# Patient Record
Sex: Female | Born: 1955 | Race: Black or African American | Hispanic: No | Smoking: Never smoker
Health system: Southern US, Community
[De-identification: ages and names within clinical notes are randomized; demographics above are authoritative.]

## PROBLEM LIST (undated history)

## (undated) DIAGNOSIS — I10 Essential (primary) hypertension: Secondary | ICD-10-CM

## (undated) HISTORY — DX: Essential (primary) hypertension: I10

## (undated) HISTORY — PX: DILATION AND CURETTAGE OF UTERUS: SHX78

---

## 2004-10-15 ENCOUNTER — Ambulatory Visit: Payer: Self-pay | Admitting: Family Medicine

## 2007-06-27 ENCOUNTER — Ambulatory Visit: Payer: Self-pay | Admitting: Family Medicine

## 2009-01-27 ENCOUNTER — Ambulatory Visit: Payer: Self-pay | Admitting: Family Medicine

## 2010-02-03 ENCOUNTER — Ambulatory Visit: Payer: Self-pay | Admitting: Family Medicine

## 2011-04-08 ENCOUNTER — Ambulatory Visit: Payer: Self-pay | Admitting: Family Medicine

## 2013-09-03 ENCOUNTER — Ambulatory Visit: Payer: Self-pay | Admitting: Family Medicine

## 2013-09-07 ENCOUNTER — Ambulatory Visit: Payer: Self-pay | Admitting: Family Medicine

## 2014-10-25 ENCOUNTER — Other Ambulatory Visit: Payer: Self-pay | Admitting: *Deleted

## 2014-10-28 ENCOUNTER — Other Ambulatory Visit: Payer: Self-pay | Admitting: Family Medicine

## 2014-10-28 DIAGNOSIS — Z1231 Encounter for screening mammogram for malignant neoplasm of breast: Secondary | ICD-10-CM

## 2014-11-13 ENCOUNTER — Ambulatory Visit
Admission: RE | Admit: 2014-11-13 | Discharge: 2014-11-13 | Disposition: A | Discharge status: Home / Self Care | Payer: Commercial Indemnity | Source: Ambulatory Visit | Attending: Family Medicine | Admitting: Family Medicine

## 2014-11-13 ENCOUNTER — Other Ambulatory Visit: Payer: Self-pay | Admitting: Family Medicine

## 2014-11-13 DIAGNOSIS — Z1231 Encounter for screening mammogram for malignant neoplasm of breast: Secondary | ICD-10-CM

## 2014-11-13 DIAGNOSIS — N63 Unspecified lump in breast: Secondary | ICD-10-CM | POA: Insufficient documentation

## 2014-12-26 ENCOUNTER — Ambulatory Visit (INDEPENDENT_AMBULATORY_CARE_PROVIDER_SITE_OTHER): Payer: Commercial Indemnity | Admitting: Obstetrics and Gynecology

## 2014-12-26 ENCOUNTER — Encounter: Payer: Self-pay | Admitting: Obstetrics and Gynecology

## 2014-12-26 VITALS — BP 168/76 | HR 72 | Ht 65.0 in | Wt 178.6 lb

## 2014-12-26 DIAGNOSIS — IMO0002 Reserved for concepts with insufficient information to code with codable children: Secondary | ICD-10-CM

## 2014-12-26 DIAGNOSIS — R888 Abnormal findings in other body fluids and substances: Secondary | ICD-10-CM | POA: Diagnosis not present

## 2014-12-26 DIAGNOSIS — B977 Papillomavirus as the cause of diseases classified elsewhere: Secondary | ICD-10-CM | POA: Diagnosis not present

## 2014-12-26 NOTE — Progress Notes (Signed)
GYNECOLOGY CLINIC PROGRESS NOTE Subjective:     Autumn Pena is a 59 y.o. woman who comes in today for a discussion of abnormal pap and possible colposcopy.  Referred from Dr. Franco Nonesheryl Lindley.   Her most recent annual exam was on 10/23/14. Her most recent Pap smear was on 10/23/14 and showed NILM but HR HPV+. Previous abnormal Pap smears: no. Contraception: post menopausal status  The following portions of the patient's history were reviewed and updated as appropriate: allergies, current medications, past family history, past medical history, past social history, past surgical history and problem list.  Review of Systems A comprehensive review of systems was negative.   Objective:    BP 168/76 mmHg  Pulse 72  Ht 5\' 5"  (1.651 m)  Wt 178 lb 9.6 oz (81.012 kg)  BMI 29.72 kg/m2 Pelvic Exam: cervix normal in appearance, external genitalia normal and vagina normal without discharge.  Assessment:   NILM pap with + HR HPV   Plan:   Discussion had with patient regarding HPV infection and management options, including proceeding directly to colposcopy, or performing HPV genotyping, and proceeding to colposcopy only if HR types 16 or 18 are present (types likley to cause cervical cancer and dysplasia).  Patient desires to perform genotyping.  Testing performed today.   Handouts given on colposcopy in case testing reveals that further workupis warranted, as well as information on HPV infection.  Call back in 1 week for results.     Hildred LaserAnika Jeneen Doutt, MD [facility]

## 2015-01-04 LAB — HPV DNA PROBE HIGH RISK, AMPLIFIED: HPV, HIGH-RISK: NEGATIVE

## 2015-01-07 ENCOUNTER — Telehealth: Payer: Self-pay | Admitting: Obstetrics and Gynecology

## 2015-01-07 NOTE — Telephone Encounter (Signed)
Called pt informed her of negative HPV results.

## 2015-01-07 NOTE — Telephone Encounter (Signed)
Patient called requesting lab results from 12/26/14.Thanks

## 2015-11-25 ENCOUNTER — Other Ambulatory Visit: Payer: Self-pay | Admitting: Family Medicine

## 2015-11-26 ENCOUNTER — Other Ambulatory Visit: Payer: Self-pay | Admitting: Family Medicine

## 2015-11-26 DIAGNOSIS — Z1231 Encounter for screening mammogram for malignant neoplasm of breast: Secondary | ICD-10-CM

## 2015-12-25 ENCOUNTER — Other Ambulatory Visit: Payer: Commercial Indemnity

## 2015-12-25 ENCOUNTER — Ambulatory Visit: Payer: Commercial Indemnity

## 2016-01-05 ENCOUNTER — Ambulatory Visit
Admission: RE | Admit: 2016-01-05 | Discharge: 2016-01-05 | Disposition: A | Discharge status: Home / Self Care | Payer: Commercial Indemnity | Source: Ambulatory Visit | Attending: Family Medicine | Admitting: Family Medicine

## 2016-01-05 DIAGNOSIS — N6311 Unspecified lump in the right breast, upper outer quadrant: Secondary | ICD-10-CM | POA: Insufficient documentation

## 2016-01-05 DIAGNOSIS — N6310 Unspecified lump in the right breast, unspecified quadrant: Secondary | ICD-10-CM | POA: Diagnosis present

## 2016-01-05 DIAGNOSIS — Z1231 Encounter for screening mammogram for malignant neoplasm of breast: Secondary | ICD-10-CM | POA: Diagnosis not present

## 2016-12-22 ENCOUNTER — Other Ambulatory Visit: Payer: Self-pay | Admitting: Family Medicine

## 2016-12-22 DIAGNOSIS — Z1231 Encounter for screening mammogram for malignant neoplasm of breast: Secondary | ICD-10-CM

## 2017-01-13 ENCOUNTER — Ambulatory Visit
Admission: RE | Admit: 2017-01-13 | Discharge: 2017-01-13 | Disposition: A | Discharge status: Home / Self Care | Payer: Managed Care, Other (non HMO) | Source: Ambulatory Visit | Attending: Family Medicine | Admitting: Family Medicine

## 2017-01-13 DIAGNOSIS — Z1231 Encounter for screening mammogram for malignant neoplasm of breast: Secondary | ICD-10-CM | POA: Insufficient documentation

## 2017-12-07 ENCOUNTER — Other Ambulatory Visit: Payer: Self-pay | Admitting: Family Medicine

## 2017-12-07 DIAGNOSIS — Z1231 Encounter for screening mammogram for malignant neoplasm of breast: Secondary | ICD-10-CM

## 2018-01-23 ENCOUNTER — Ambulatory Visit
Admission: RE | Admit: 2018-01-23 | Discharge: 2018-01-23 | Disposition: A | Discharge status: Home / Self Care | Payer: Managed Care, Other (non HMO) | Source: Ambulatory Visit | Attending: Family Medicine | Admitting: Family Medicine

## 2018-01-23 DIAGNOSIS — Z1231 Encounter for screening mammogram for malignant neoplasm of breast: Secondary | ICD-10-CM | POA: Diagnosis present

## 2019-01-03 ENCOUNTER — Other Ambulatory Visit: Payer: Self-pay | Admitting: Family Medicine

## 2019-01-03 DIAGNOSIS — Z1231 Encounter for screening mammogram for malignant neoplasm of breast: Secondary | ICD-10-CM

## 2019-01-25 ENCOUNTER — Ambulatory Visit
Admission: RE | Admit: 2019-01-25 | Discharge: 2019-01-25 | Disposition: A | Discharge status: Home / Self Care | Payer: Managed Care, Other (non HMO) | Source: Ambulatory Visit | Attending: Family Medicine | Admitting: Family Medicine

## 2019-01-25 DIAGNOSIS — Z1231 Encounter for screening mammogram for malignant neoplasm of breast: Secondary | ICD-10-CM | POA: Insufficient documentation

## 2020-01-17 ENCOUNTER — Other Ambulatory Visit: Payer: Self-pay | Admitting: Family Medicine

## 2020-01-17 DIAGNOSIS — Z1231 Encounter for screening mammogram for malignant neoplasm of breast: Secondary | ICD-10-CM

## 2020-01-29 ENCOUNTER — Ambulatory Visit
Admission: RE | Admit: 2020-01-29 | Discharge: 2020-01-29 | Disposition: A | Discharge status: Home / Self Care | Payer: Medicare HMO | Source: Ambulatory Visit | Attending: Family Medicine | Admitting: Family Medicine

## 2020-01-29 ENCOUNTER — Other Ambulatory Visit: Payer: Self-pay

## 2020-01-29 DIAGNOSIS — Z1231 Encounter for screening mammogram for malignant neoplasm of breast: Secondary | ICD-10-CM | POA: Diagnosis present

## 2020-12-25 ENCOUNTER — Other Ambulatory Visit: Payer: Self-pay | Admitting: Family Medicine

## 2020-12-25 DIAGNOSIS — Z1231 Encounter for screening mammogram for malignant neoplasm of breast: Secondary | ICD-10-CM

## 2021-01-29 ENCOUNTER — Ambulatory Visit
Admission: RE | Admit: 2021-01-29 | Discharge: 2021-01-29 | Disposition: A | Discharge status: Home / Self Care | Payer: Medicare HMO | Source: Ambulatory Visit | Attending: Family Medicine | Admitting: Family Medicine

## 2021-01-29 ENCOUNTER — Other Ambulatory Visit: Payer: Self-pay

## 2021-01-29 DIAGNOSIS — Z1231 Encounter for screening mammogram for malignant neoplasm of breast: Secondary | ICD-10-CM | POA: Diagnosis not present

## 2022-02-03 ENCOUNTER — Other Ambulatory Visit: Payer: Self-pay | Admitting: Family Medicine

## 2022-02-03 DIAGNOSIS — Z1231 Encounter for screening mammogram for malignant neoplasm of breast: Secondary | ICD-10-CM

## 2022-02-19 ENCOUNTER — Ambulatory Visit
Admission: RE | Admit: 2022-02-19 | Discharge: 2022-02-19 | Disposition: A | Discharge status: Home / Self Care | Payer: Medicare PPO | Source: Ambulatory Visit | Attending: Family Medicine | Admitting: Family Medicine

## 2022-02-19 DIAGNOSIS — Z1231 Encounter for screening mammogram for malignant neoplasm of breast: Secondary | ICD-10-CM

## 2022-02-26 ENCOUNTER — Other Ambulatory Visit: Payer: Self-pay | Admitting: Family Medicine

## 2022-02-26 DIAGNOSIS — R928 Other abnormal and inconclusive findings on diagnostic imaging of breast: Secondary | ICD-10-CM

## 2022-02-26 DIAGNOSIS — N6489 Other specified disorders of breast: Secondary | ICD-10-CM

## 2022-03-05 ENCOUNTER — Ambulatory Visit
Admission: RE | Admit: 2022-03-05 | Discharge: 2022-03-05 | Disposition: A | Discharge status: Home / Self Care | Payer: Medicare PPO | Source: Ambulatory Visit | Attending: Family Medicine | Admitting: Family Medicine

## 2022-03-05 DIAGNOSIS — R928 Other abnormal and inconclusive findings on diagnostic imaging of breast: Secondary | ICD-10-CM

## 2022-03-05 DIAGNOSIS — N6489 Other specified disorders of breast: Secondary | ICD-10-CM | POA: Insufficient documentation

## 2022-04-17 IMAGING — MG MM DIGITAL SCREENING BILAT W/ TOMO AND CAD
8 series · 8 of 24 positions shown · non-contrast
Comparison: Previous exam(s).

CLINICAL DATA: Screening.

EXAM:
DIGITAL SCREENING BILATERAL MAMMOGRAM WITH TOMOSYNTHESIS AND CAD
TECHNIQUE: Bilateral screening digital craniocaudal and mediolateral oblique
mammograms were obtained. Bilateral screening digital breast
tomosynthesis was performed. The images were evaluated with
computer-aided detection.

[L MLO synth-2D]
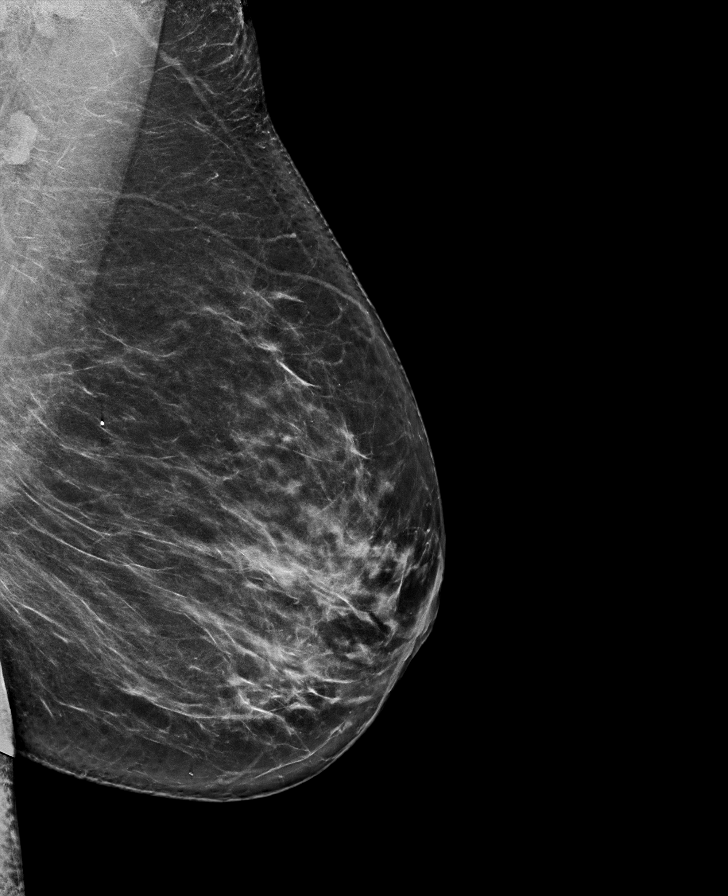

[L CC synth-2D]
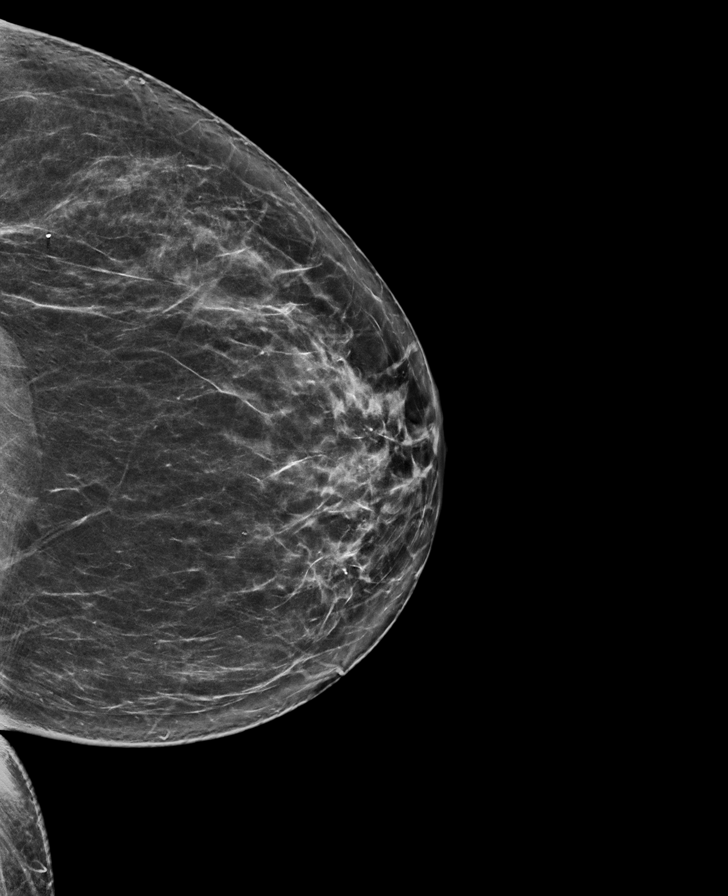

[R CC synth-2D]
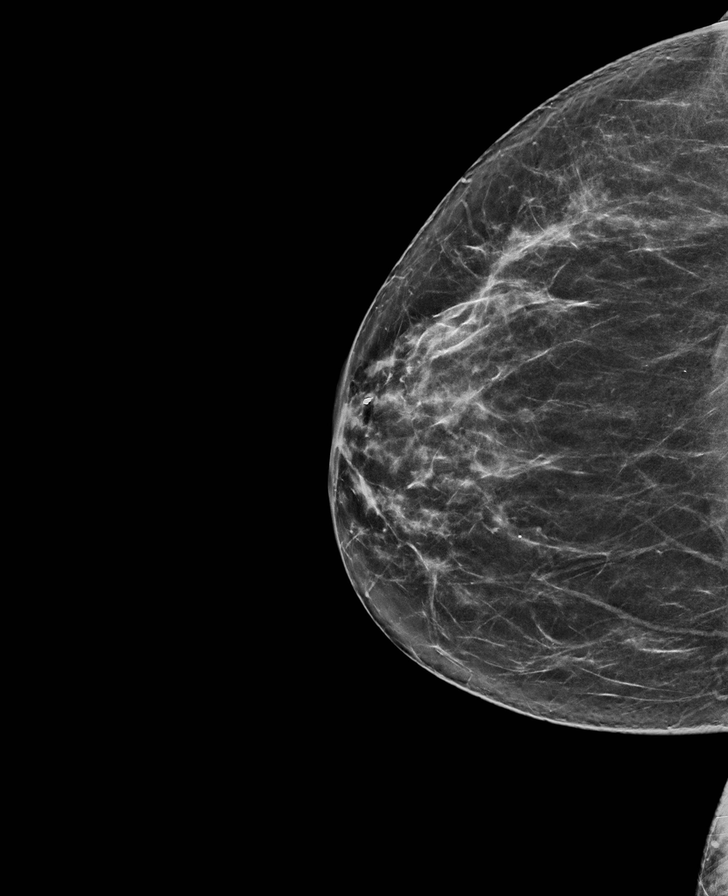

[R MLO synth-2D]
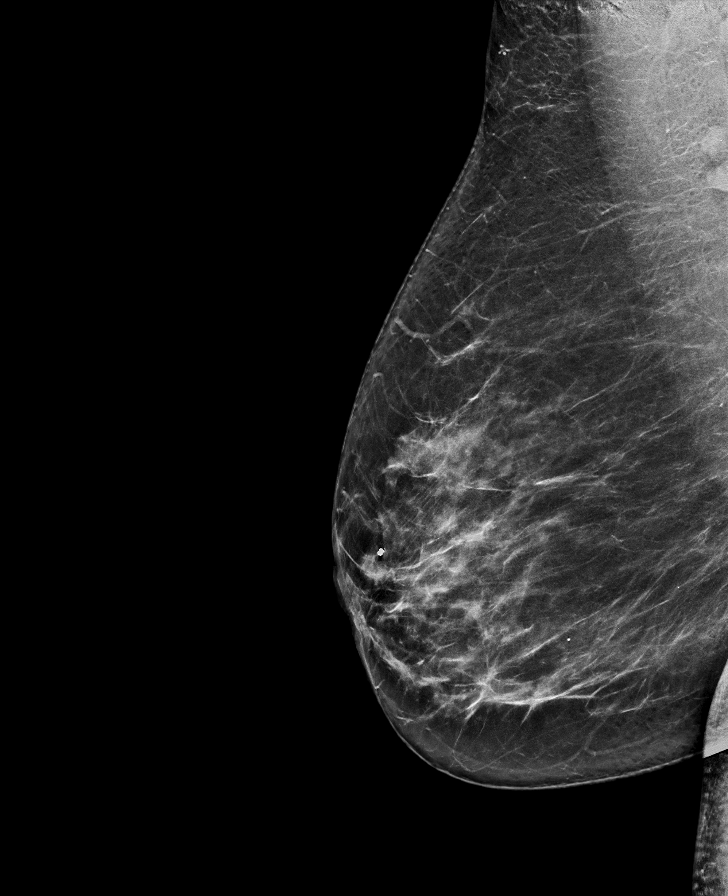

[R MLO tomo · tomo slice 41/81.0]
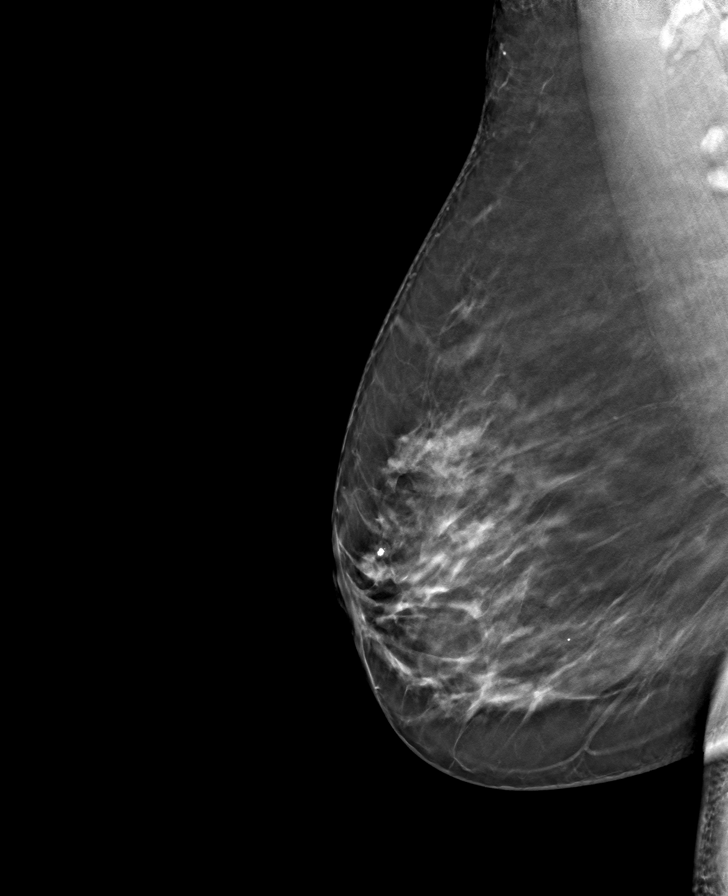

[R CC tomo · tomo slice 35/70.0]
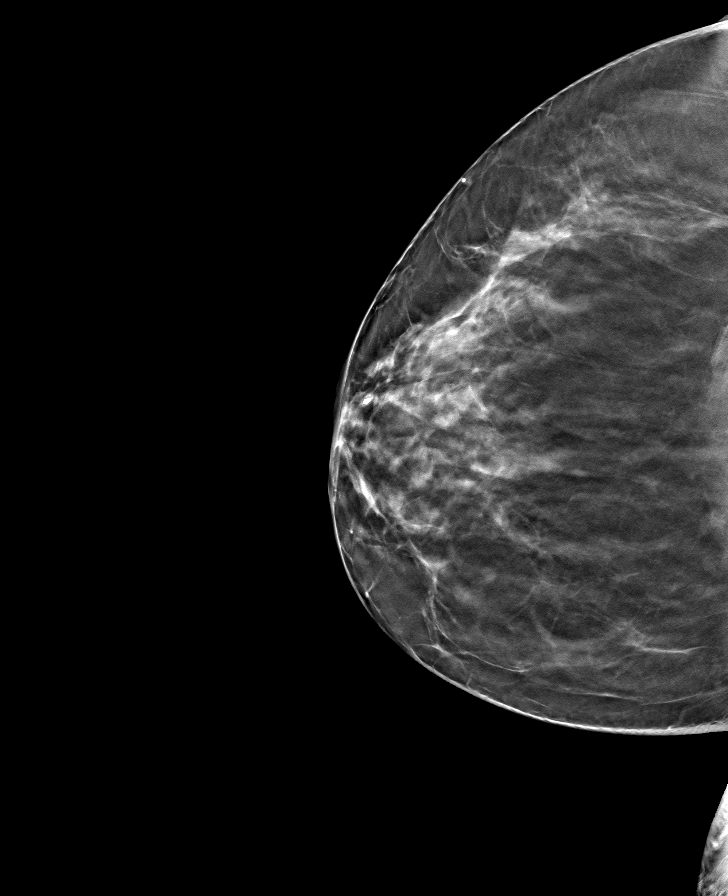

[L CC tomo · tomo slice 39/76.0]
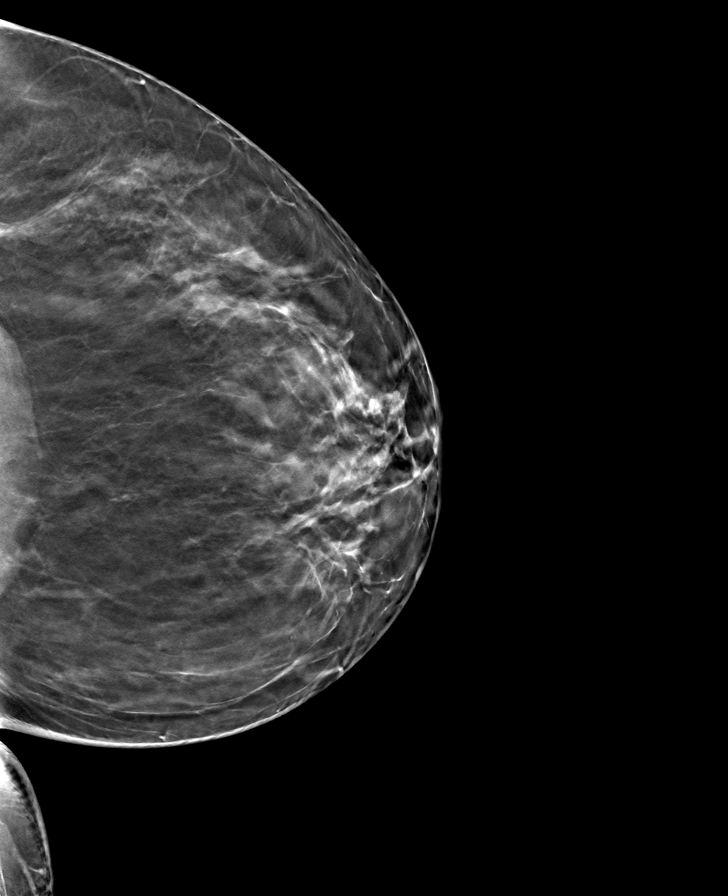

[L MLO tomo · tomo slice 43/85.0]
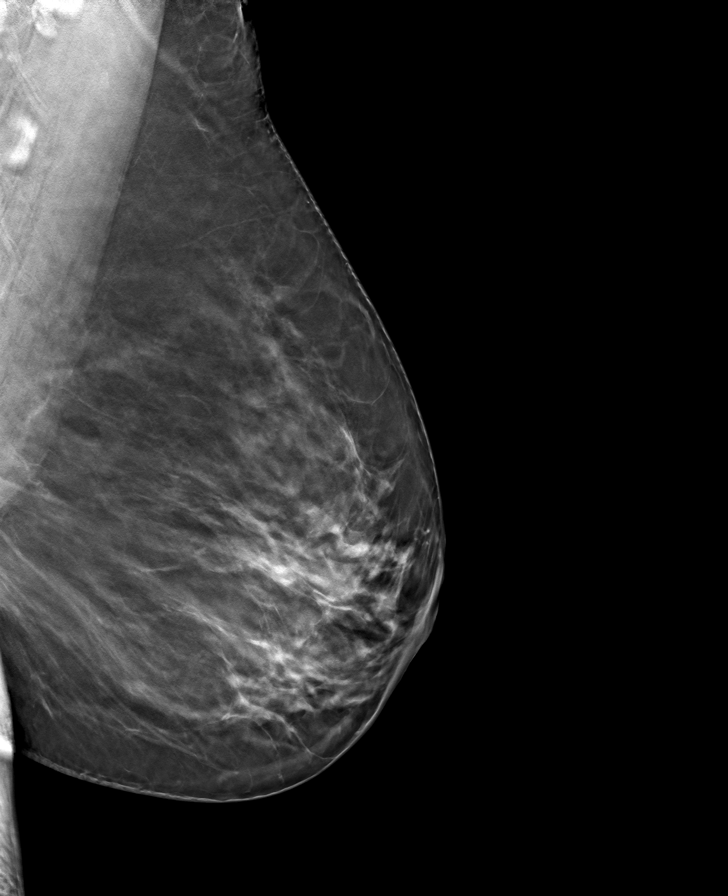

[8 of 24 positions shown; findings below may reference images not displayed]

ACR Breast Density Category c: The breast tissue is heterogeneously
dense, which may obscure small masses.
FINDINGS: There are no findings suspicious for malignancy.
IMPRESSION: No mammographic evidence of malignancy. A result letter of this
screening mammogram will be mailed directly to the patient.

RECOMMENDATION:
Screening mammogram in one year. (Code:Q3-W-BC3)

BI-RADS CATEGORY  1: Negative.
